# Patient Record
Sex: Female | Born: 1990 | Race: Black or African American | Hispanic: No | Marital: Single | Smoking: Never smoker
Health system: Southern US, Community
[De-identification: ages and names within clinical notes are randomized; demographics above are authoritative.]

## PROBLEM LIST (undated history)

## (undated) DIAGNOSIS — J45909 Unspecified asthma, uncomplicated: Secondary | ICD-10-CM

## (undated) DIAGNOSIS — K219 Gastro-esophageal reflux disease without esophagitis: Secondary | ICD-10-CM

## (undated) HISTORY — PX: LAPAROSCOPIC GASTRIC SLEEVE RESECTION: SHX5895

---

## 2015-06-21 ENCOUNTER — Encounter (HOSPITAL_BASED_OUTPATIENT_CLINIC_OR_DEPARTMENT_OTHER): Payer: Self-pay | Admitting: *Deleted

## 2015-06-21 ENCOUNTER — Emergency Department (HOSPITAL_BASED_OUTPATIENT_CLINIC_OR_DEPARTMENT_OTHER): Payer: Medicaid Other

## 2015-06-21 ENCOUNTER — Emergency Department (HOSPITAL_BASED_OUTPATIENT_CLINIC_OR_DEPARTMENT_OTHER)
Admission: EM | Admit: 2015-06-21 | Discharge: 2015-06-21 | Disposition: A | Discharge status: Home / Self Care | Payer: Medicaid Other | Attending: Emergency Medicine | Admitting: Emergency Medicine

## 2015-06-21 DIAGNOSIS — J45901 Unspecified asthma with (acute) exacerbation: Secondary | ICD-10-CM | POA: Insufficient documentation

## 2015-06-21 DIAGNOSIS — Z79899 Other long term (current) drug therapy: Secondary | ICD-10-CM | POA: Insufficient documentation

## 2015-06-21 DIAGNOSIS — R059 Cough, unspecified: Secondary | ICD-10-CM

## 2015-06-21 DIAGNOSIS — R05 Cough: Secondary | ICD-10-CM

## 2015-06-21 DIAGNOSIS — R0789 Other chest pain: Secondary | ICD-10-CM

## 2015-06-21 DIAGNOSIS — R000 Tachycardia, unspecified: Secondary | ICD-10-CM | POA: Diagnosis not present

## 2015-06-21 DIAGNOSIS — K219 Gastro-esophageal reflux disease without esophagitis: Secondary | ICD-10-CM | POA: Diagnosis not present

## 2015-06-21 DIAGNOSIS — Z7951 Long term (current) use of inhaled steroids: Secondary | ICD-10-CM | POA: Diagnosis not present

## 2015-06-21 DIAGNOSIS — R0602 Shortness of breath: Secondary | ICD-10-CM | POA: Diagnosis present

## 2015-06-21 HISTORY — DX: Gastro-esophageal reflux disease without esophagitis: K21.9

## 2015-06-21 HISTORY — DX: Unspecified asthma, uncomplicated: J45.909

## 2015-06-21 MED ORDER — IBUPROFEN 600 MG PO TABS: 600.0000 mg | ORAL_TABLET | Freq: Four times a day (QID) | ORAL | Status: AC | PRN

## 2015-06-21 MED ORDER — IBUPROFEN 800 MG PO TABS
800.0000 mg | ORAL_TABLET | Freq: Once | ORAL | Status: AC
  Administered 2015-06-21: 800 mg via ORAL
  Filled 2015-06-21: qty 1

## 2015-06-21 NOTE — ED Provider Notes (Signed)
CSN: [id number]     Arrival date & time 06/21/15  2104 History   First MD Initiated Contact with Patient 06/21/15 2123     Chief Complaint  Patient presents with  . Shortness of Breath     (Consider location/radiation/quality/duration/timing/severity/associated sxs/prior Treatment) HPI Michele Goodman is a 25 y.o. female who comes in for evaluation of shortness of breath. Patient reports symptoms have been ongoing and intermittent over the past one day. Endorses associated rhinorrhea, itchy and watery eyes, and cough. She reports associated left-sided chest discomfort that is worse with coughing. She denies any fevers, chills, leg swelling, hemoptysis, recent travel or surgeries, history of blood clot. States discomfort feels a lot like "when I had walking pneumonia". Denies any exertional discomfort. No other aggravating or modifying factors.  Past Medical History  Diagnosis Date  . Asthma   . Acid reflux    Past Surgical History  Procedure Laterality Date  . Laparoscopic gastric sleeve resection     No family history on file. Social History  Substance Use Topics  . Smoking status: Never Smoker   . Smokeless tobacco: Not on file  . Alcohol Use: Yes   OB History    No data available     Review of Systems A 10 point review of systems was completed and was negative except for pertinent positives and negatives as mentioned in the history of present illness     Allergies  Shrimp  Home Medications   Prior to Admission medications   Medication Sig Start Date End Date Taking? Authorizing Provider  esomeprazole (NEXIUM) 20 MG capsule Take 20 mg by mouth daily at 12 noon.   Yes Historical Provider, MD  Fluticasone-Salmeterol (ADVAIR) 100-50 MCG/DOSE AEPB Inhale 1 puff into the lungs 2 (two) times daily.   Yes Historical Provider, MD  ibuprofen (ADVIL,MOTRIN) 600 MG tablet Take 1 tablet (600 mg total) by mouth every 6 (six) hours as needed. 06/21/15   Rourke Mcquitty, PA-C   BP  120/74 mmHg  Pulse 97  Temp(Src) 98.6 F (37 C) (Oral)  Resp 18  Ht  (1.6 m)  Wt 133.811 kg  BMI 52.27 kg/m2  SpO2 100%  LMP 06/04/2015 Physical Exam  Constitutional: She is oriented to person, place, and time. She appears well-developed and well-nourished.  Morbidly obese African-American female  HENT:  Head: Normocephalic and atraumatic.  Mouth/Throat: Oropharynx is clear and moist.  Eyes: Conjunctivae are normal. Pupils are equal, round, and reactive to light. Right eye exhibits no discharge. Left eye exhibits no discharge. No scleral icterus.  Neck: Normal range of motion. Neck supple.  Cardiovascular: Normal rate, regular rhythm and normal heart sounds.   Pulmonary/Chest: Effort normal and breath sounds normal. No respiratory distress. She has no wheezes. She has no rales.  Chest discomfort is replicated with palpation of left pectoralis major musculature and intercostal spaces along sternal border. No other rashes or deformities, crepitus or other abnormalities noted.  Abdominal: Soft. There is no tenderness.  Musculoskeletal: Normal range of motion. She exhibits no edema or tenderness.  No unilateral leg swelling  Neurological: She is alert and oriented to person, place, and time.  Cranial Nerves II-XII grossly intact  Skin: Skin is warm and dry. No rash noted.  Psychiatric: She has a normal mood and affect.  Nursing note and vitals reviewed.   ED Course  Procedures (including critical care time) Labs Review Labs Reviewed - No data to display  Imaging Review Dg Chest 2 View  06/21/2015  CLINICAL DATA:  25 year old with current history of asthma presenting with acute onset of shortness of breath that began this morning. EXAM: CHEST  2 VIEW COMPARISON:  None. FINDINGS: Cardiomediastinal silhouette unremarkable. Lungs clear. Bronchovascular markings normal. Pulmonary vascularity normal. No pneumothorax. No pleural effusions. Visualized bony thorax intact. IMPRESSION:  Normal examination. Electronically Signed   By: Hulan Saas M.D.   On: 06/21/2015 22:23   I have personally reviewed and evaluated these images and lab results as part of my medical decision-making.   EKG Interpretation   Date/Time:  Saturday June 21 2015 22:08:47 EST Ventricular Rate:  91 PR Interval:  159 QRS Duration: 90 QT Interval:  369 QTC Calculation: 454 R Axis:   27 Text Interpretation:  Sinus rhythm Borderline T abnormalities, anterior  leads ED PHYSICIAN INTERPRETATION AVAILABLE IN [facility] Confirmed  by TEST, Record ([id number]) on 06/22/2015 9:32:57 AM     Meds given in ED:  Medications  ibuprofen (ADVIL,MOTRIN) tablet 800 mg (800 mg Oral Given 06/21/15 2338)    Discharge Medication List as of 06/21/2015 11:21 PM    START taking these medications   Details  ibuprofen (ADVIL,MOTRIN) 600 MG tablet Take 1 tablet (600 mg total) by mouth every 6 (six) hours as needed., Starting 06/21/2015, Until Discontinued, Print       Filed Vitals:   06/21/15 2111 06/21/15 2300  BP: 157/78 120/74  Pulse: 101 97  Temp: 98.6 F (37 C)   TempSrc: Oral   Resp: 18 18  Height:  (1.6 m)   Weight: 133.811 kg   SpO2: 100% 100%    MDM  Michele Goodman is a 25 y.o. female with a history of asthma who comes in for evaluation of cough, runny nose chest wall discomfort. On arrival, she is hemodynamically stable with a very slight tachycardia at 101. She is afebrile. Her exam is grossly unremarkable. EKG is reassuring. Chest x-ray shows no acute cardio pulmonary pathology. Tenderness is replicated with palpation of intercostal spaces in left peristernal area. Low suspicion for pulmonary embolus with no hypoxia, heart rate on EKG is 91. Low risk per well's criteria. Not consistent with ACS. Discussed continued OTC medications for URI at home as well as Motrin for chest wall discomfort. Patient verbalizes understanding and agrees with this plan. Discussed return precautions.  Overall, patient appears very well, hemodynamically stable and appropriate for discharge Final diagnoses:  Cough  Chest wall pain       Joycie Peek, PA-C 06/22/15 2321  Jerelyn Scott, MD 06/25/15 [phone]

## 2015-06-21 NOTE — Discharge Instructions (Signed)
°  There does not appear to be an emergent cause for your symptoms at this time. Your exam was reassuring as well as your chest x-ray and EKG. Please take her Motrin as needed for discomfort. Follow-up with your doctor as needed for reevaluation. Return to ED for any new or worsening symptoms.  Chest Wall Pain Chest wall pain is pain in or around the bones and muscles of your chest. Sometimes, an injury causes this pain. Sometimes, the cause may not be known. This pain may take several weeks or longer to get better. HOME CARE INSTRUCTIONS  Pay attention to any changes in your symptoms. Take these actions to help with your pain:   Rest as told by your health care provider.   Avoid activities that cause pain. These include any activities that use your chest muscles or your abdominal and side muscles to lift heavy items.   If directed, apply ice to the painful area:  Put ice in a plastic bag.  Place a towel between your skin and the bag.  Leave the ice on for 20 minutes, 2-3 times per day.  Take over-the-counter and prescription medicines only as told by your health care provider.  Do not use tobacco products, including cigarettes, chewing tobacco, and e-cigarettes. If you need help quitting, ask your health care provider.  Keep all follow-up visits as told by your health care provider. This is important. SEEK MEDICAL CARE IF:  You have a fever.  Your chest pain becomes worse.  You have new symptoms. SEEK IMMEDIATE MEDICAL CARE IF:  You have nausea or vomiting.  You feel sweaty or light-headed.  You have a cough with phlegm (sputum) or you cough up blood.  You develop shortness of breath.   This information is not intended to replace advice given to you by your health care provider. Make sure you discuss any questions you have with your health care provider.   Document Released: 05/10/2005 Document Revised: 01/29/2015 Document Reviewed: 08/05/2014 Elsevier Interactive  Patient Education [employer]

## 2015-06-21 NOTE — ED Provider Notes (Signed)
ED ECG REPORT   Date: 06/21/2015  Rate: 91  Rhythm: normal sinus rhythm  QRS Axis: normal  Intervals: normal  ST/T Wave abnormalities: nonspecific T wave changes  Conduction Disutrbances:none  Narrative Interpretation:   Old EKG Reviewed: none available  Link in epic not working for interpretation in muse  Jerelyn Scott, MD 06/21/15 2328

## 2015-06-21 NOTE — ED Notes (Signed)
Patient is alert and oriented x4.  She is complaining of shortness of breath that started this morning. Patient stated that she has a Hx asthma.  She has diminished lung sounds.

## 2015-06-21 NOTE — ED Notes (Signed)
C/o L chest pain, worse with cough, also reports dry cough and sob, states, "feel like I am starting a cold, but haven't had any wheezing", also lists: dry scratchy throat, tearing/itchy eyes. States, "no relief with albuterol neb or advair inhaler". Sx worse throughout today.

## 2017-03-31 ENCOUNTER — Encounter (HOSPITAL_BASED_OUTPATIENT_CLINIC_OR_DEPARTMENT_OTHER): Payer: Self-pay | Admitting: *Deleted

## 2017-03-31 ENCOUNTER — Emergency Department (HOSPITAL_BASED_OUTPATIENT_CLINIC_OR_DEPARTMENT_OTHER): Payer: BLUE CROSS/BLUE SHIELD

## 2017-03-31 ENCOUNTER — Emergency Department (HOSPITAL_BASED_OUTPATIENT_CLINIC_OR_DEPARTMENT_OTHER)
Admission: EM | Admit: 2017-03-31 | Discharge: 2017-03-31 | Disposition: A | Discharge status: Home / Self Care | Payer: BLUE CROSS/BLUE SHIELD | Attending: Emergency Medicine | Admitting: Emergency Medicine

## 2017-03-31 ENCOUNTER — Other Ambulatory Visit: Payer: Self-pay

## 2017-03-31 DIAGNOSIS — J45909 Unspecified asthma, uncomplicated: Secondary | ICD-10-CM | POA: Diagnosis not present

## 2017-03-31 DIAGNOSIS — Z79899 Other long term (current) drug therapy: Secondary | ICD-10-CM | POA: Diagnosis not present

## 2017-03-31 DIAGNOSIS — G44209 Tension-type headache, unspecified, not intractable: Secondary | ICD-10-CM | POA: Insufficient documentation

## 2017-03-31 DIAGNOSIS — R51 Headache: Secondary | ICD-10-CM | POA: Diagnosis present

## 2017-03-31 MED ORDER — IBUPROFEN 600 MG PO TABS: 600.0000 mg | ORAL_TABLET | Freq: Four times a day (QID) | ORAL | 0 refills | Status: AC | PRN

## 2017-03-31 MED ORDER — DIPHENHYDRAMINE HCL 50 MG/ML IJ SOLN
25.0000 mg | Freq: Once | INTRAMUSCULAR | Status: AC
  Administered 2017-03-31: 25 mg via INTRAVENOUS
  Filled 2017-03-31: qty 1

## 2017-03-31 MED ORDER — PROCHLORPERAZINE EDISYLATE 5 MG/ML IJ SOLN
10.0000 mg | Freq: Once | INTRAMUSCULAR | Status: AC
  Administered 2017-03-31: 10 mg via INTRAVENOUS
  Filled 2017-03-31: qty 2

## 2017-03-31 NOTE — ED Triage Notes (Signed)
Pt reports headache radiating into neck since this past Saturday with decreased appetite. Denies fever, n/v; reports diarrhea several days ago that's resolved. Reports Tylenol and Motrin ineffective. Pt has full ROM of head. Denies known injury/trauma; denies recent childbirth.

## 2017-03-31 NOTE — ED Provider Notes (Signed)
[facility] EMERGENCY DEPARTMENT Provider Note   CSN: [id number] Arrival date & time: 03/31/17  1752     History   Chief Complaint Chief Complaint  Patient presents with  . Headache    HPI Michele Goodman is a 26 y.o. female.  The history is provided by the patient. No language interpreter was used.  Headache   The current episode started more than 2 days ago. The pain is located in the occipital region. The quality of the pain is described as throbbing. The pain is moderate. The pain radiates to the left neck and right neck. Associated symptoms include anorexia. Pertinent negatives include no syncope, no nausea and no vomiting. She has tried acetaminophen and NSAIDs for the symptoms. The treatment provided no relief.    Past Medical History:  Diagnosis Date  . Acid reflux   . Asthma     There are no active problems to display for this patient.   Past Surgical History:  Procedure Laterality Date  . LAPAROSCOPIC GASTRIC SLEEVE RESECTION      OB History    No data available       Home Medications    Prior to Admission medications   Medication Sig Start Date End Date Taking? Authorizing Provider  Fluticasone-Salmeterol (ADVAIR) 100-50 MCG/DOSE AEPB Inhale 1 puff into the lungs 2 (two) times daily.   Yes [provider]  montelukast (SINGULAIR) 10 MG tablet Take 10 mg at bedtime by mouth.   Yes [provider]  omeprazole (PRILOSEC) 40 MG capsule Take 40 mg daily by mouth.   Yes [provider]  phentermine 37.5 MG capsule Take 37.5 mg every morning by mouth.   Yes [provider]  esomeprazole (NEXIUM) 20 MG capsule Take 20 mg by mouth daily at 12 noon.    [provider]  ibuprofen (ADVIL,MOTRIN) 600 MG tablet Take 1 tablet (600 mg total) by mouth every 6 (six) hours as needed. 06/21/15   Joycie Peekartner, Benjamin, PA-C    Family History No family history on file.  Social History Social History   Tobacco Use    . Smoking status: Never Smoker  . Smokeless tobacco: Never Used  Substance Use Topics  . Alcohol use: No    Frequency: Never  . Drug use: No     Allergies   Shrimp [shellfish allergy]   Review of Systems Review of Systems  Eyes: Positive for photophobia.  Cardiovascular: Negative for syncope.  Gastrointestinal: Positive for anorexia. Negative for diarrhea, nausea and vomiting.  Neurological: Positive for headaches.  All other systems reviewed and are negative.    Physical Exam Updated Vital Signs BP (!) 148/93 (BP Location: Left Arm)   Pulse 95   Temp 99.2 F (37.3 C) (Oral)   Resp 18   Ht 5\' 4"  (1.626 m)   Wt 136 kg (299 lb 13.2 oz)   LMP 03/29/2017 (Exact Date)   SpO2 100%   BMI 51.46 kg/m   Physical Exam  Constitutional: She is oriented to person, place, and time. She appears well-developed and well-nourished. She does not appear ill.  HENT:  Head: Atraumatic.  Mouth/Throat: Oropharynx is clear and moist.  Eyes: EOM are normal. Pupils are equal, round, and reactive to light.  Neck: Neck supple. No neck rigidity.  Cardiovascular: Normal rate and regular rhythm.  Pulmonary/Chest: Effort normal and breath sounds normal.  Abdominal: Soft. Bowel sounds are normal.  Musculoskeletal: Normal range of motion.  Neurological: She is alert and oriented to person,  place, and time. She has normal strength. No cranial nerve deficit or sensory deficit.  Skin: Skin is warm and dry.  Psychiatric: She has a normal mood and affect.  Nursing note and vitals reviewed.    ED Treatments / Results  Labs (all labs ordered are listed, but only abnormal results are displayed) Labs Reviewed - No data to display  EKG  EKG Interpretation None       Radiology Ct Head Wo Contrast  Result Date: 03/31/2017 CLINICAL DATA:  Headache radiating to neck for 5 days. Decreased appetite. EXAM: CT HEAD WITHOUT CONTRAST TECHNIQUE: Contiguous axial images were obtained from the base of  the skull through the vertex without intravenous contrast. COMPARISON:  None. FINDINGS: BRAIN: No intraparenchymal hemorrhage, mass effect nor midline shift. The ventricles and sulci are normal. No acute large vascular territory infarcts. No abnormal extra-axial fluid collections. Basal cisterns are patent. VASCULAR: Unremarkable. SKULL/SOFT TISSUES: No skull fracture. No significant soft tissue swelling. ORBITS/SINUSES: Dysconjugate gaze may be transient. RIGHT maxillary sinus mucoperiosteal reaction without air-fluid level. Mastoid air cells are well aerated. OTHER: None. IMPRESSION: Negative noncontrast CT HEAD. Electronically Signed   By: Awilda Metroourtnay  Bloomer M.D.   On: 03/31/2017 21:03    Procedures Procedures (including critical care time)  Medications Ordered in ED Medications - No data to display   Initial Impression / Assessment and Plan / ED Course  I have reviewed the triage vital signs and the nursing notes.  Pertinent labs & imaging results that were available during my care of the patient were reviewed by me and considered in my medical decision making (see chart for details).     Pt HA treated and improved while in ED. Normal CT.Pt is afebrile with no focal neuro deficits, nuchal rigidity, or change in vision. Patient to follow-up with PCP.  Pt verbalizes understanding and is agreeable with plan to dc.   Final Clinical Impressions(s) / ED Diagnoses   Final diagnoses:  Tension headache    ED Discharge Orders        Ordered    ibuprofen (ADVIL,MOTRIN) 600 MG tablet  Every 6 hours PRN     03/31/17 2213       Felicie MornSmith, Zeinab Rodwell, NP 04/01/17 Pernell Dupre0008    Benjiman CorePickering, Nathan, MD 04/01/17 [phone]

## 2017-03-31 NOTE — ED Notes (Signed)
Patient transported to CT 

## 2019-09-08 IMAGING — CT CT HEAD W/O CM
3 series · 15 of 47 positions shown, 18 images · non-contrast
Comparison: None.

CLINICAL DATA: Headache radiating to neck for 5 days. Decreased
appetite.

EXAM:
CT HEAD WITHOUT CONTRAST
TECHNIQUE: Contiguous axial images were obtained from the base of the skull
through the vertex without intravenous contrast.

[Series 2: head wo · axial · 0.43mm/px · z∈[-420,-295]mm · 9 of 31 slices shown, 12 images]
[im 3/31  brain]
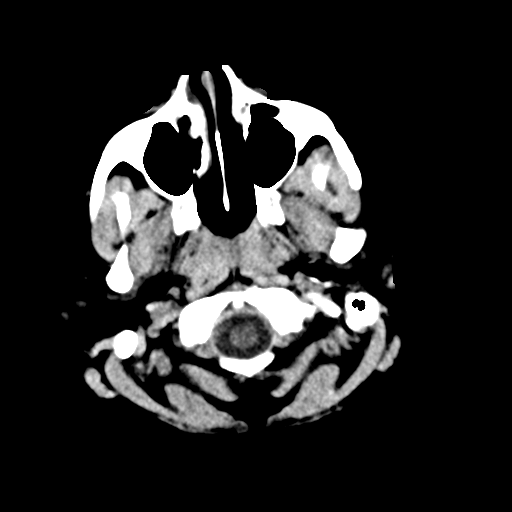
[im 3/31  bone]
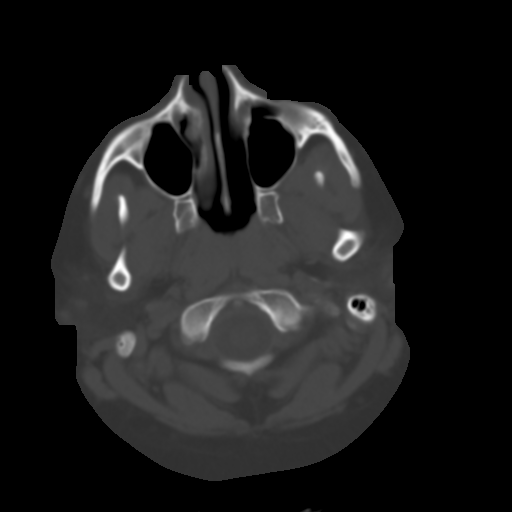
[im 6/31  brain]
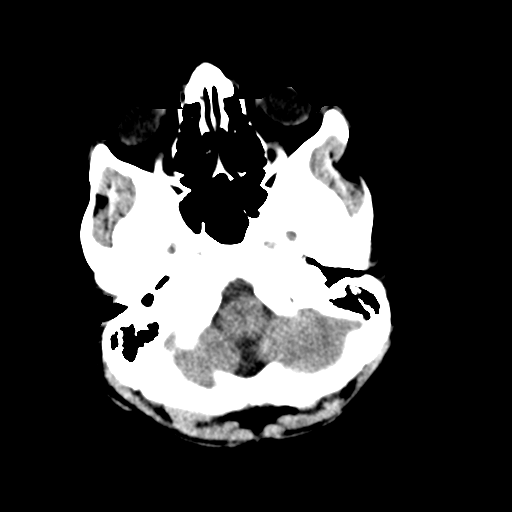
[im 9/31  brain]
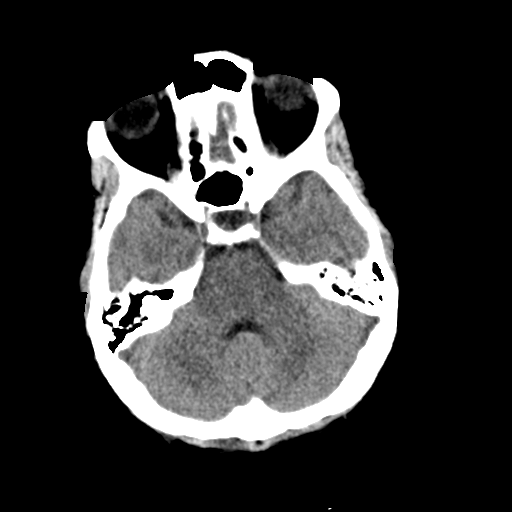
[im 12/31  brain]
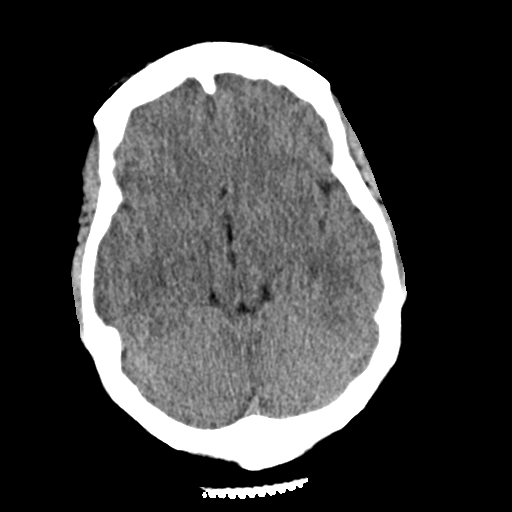
[im 16/31  brain]
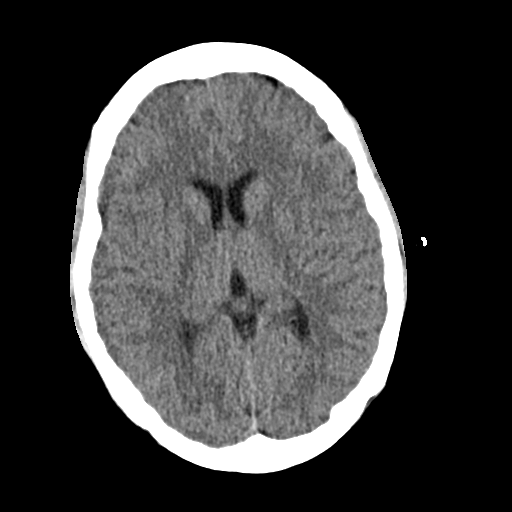
[im 16/31  bone]
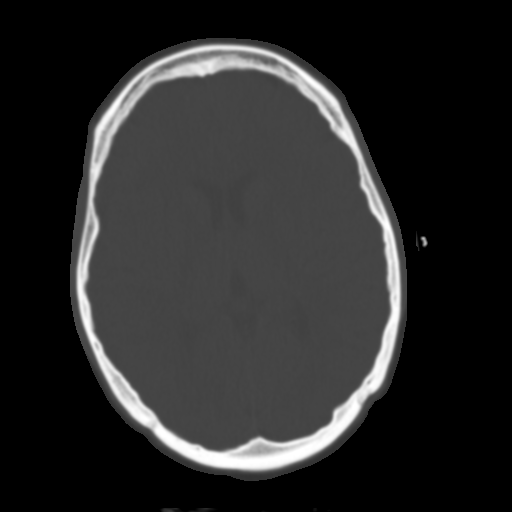
[im 19/31  brain]
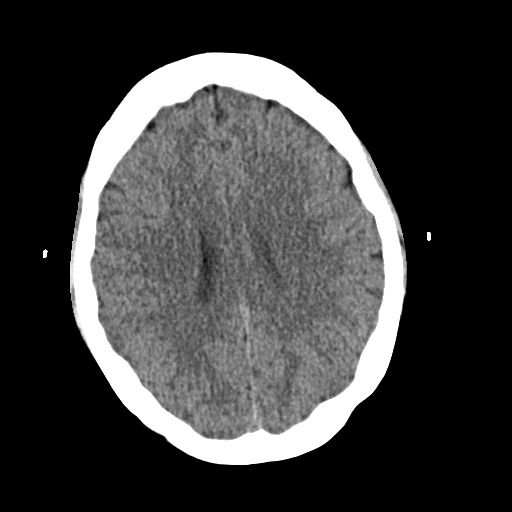
[im 22/31  brain]
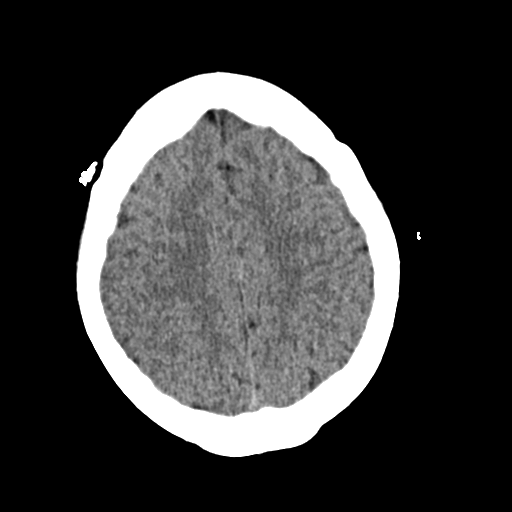
[im 25/31  brain]
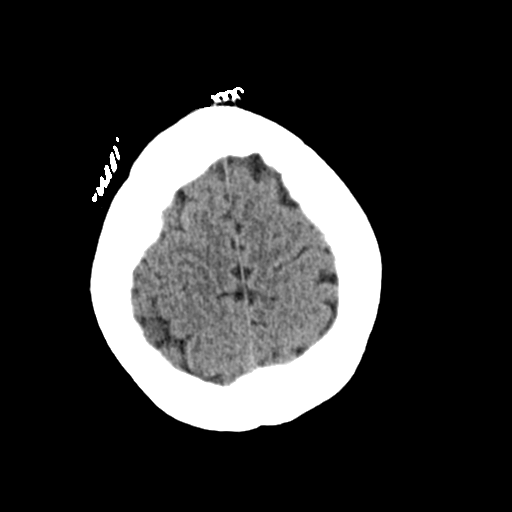
[im 28/31  brain]
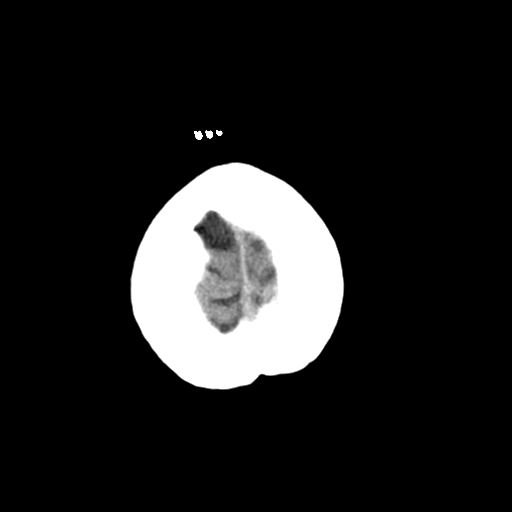
[im 28/31  bone]
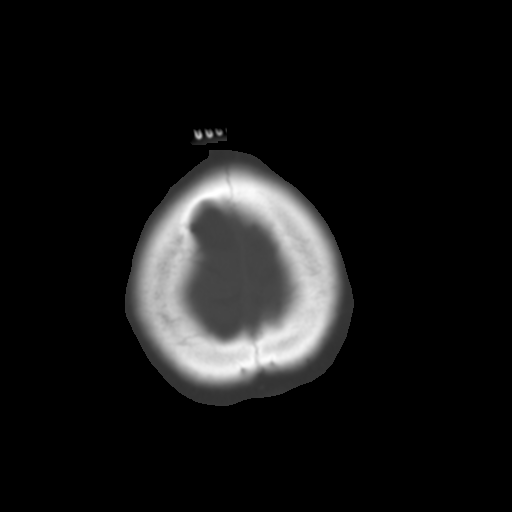

[Series 4: coronal soft · coronal · 0.30mm/px · 3 of 68 slices shown]
[im 23/68  brain]
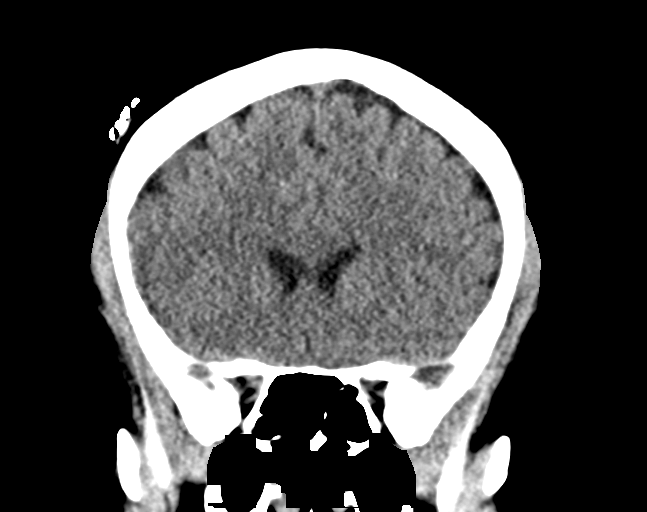
[im 30/68  brain]
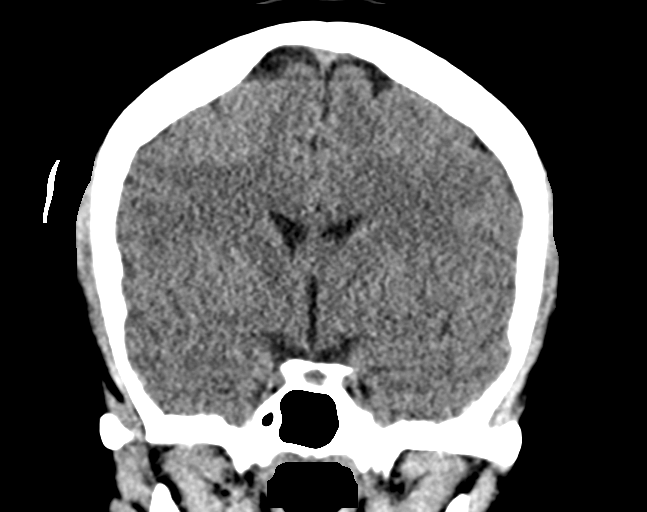
[im 38/68  brain]
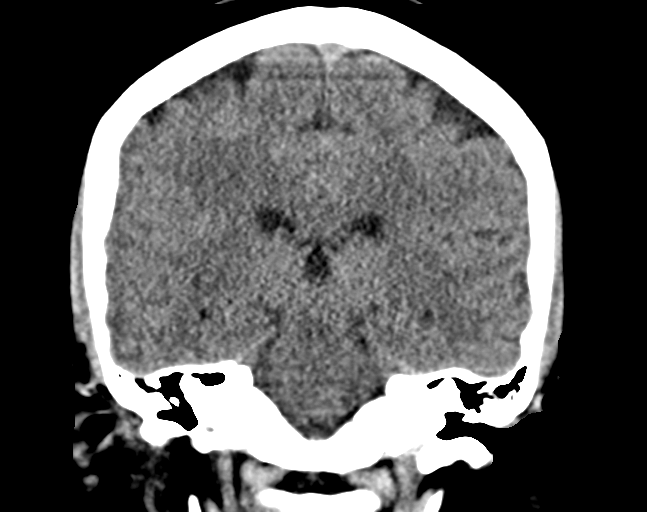

[Series 5: sag soft · sagittal · 0.30mm/px · 3 of 64 slices shown]
[im 22/64  brain]
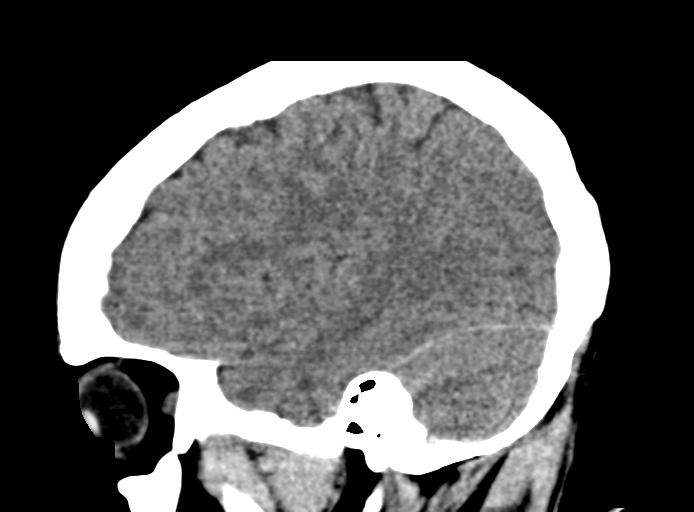
[im 32/64  brain]
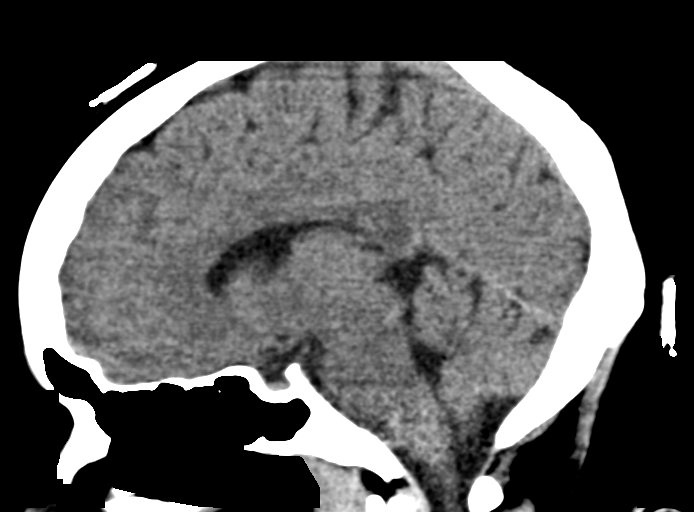
[im 43/64  brain]
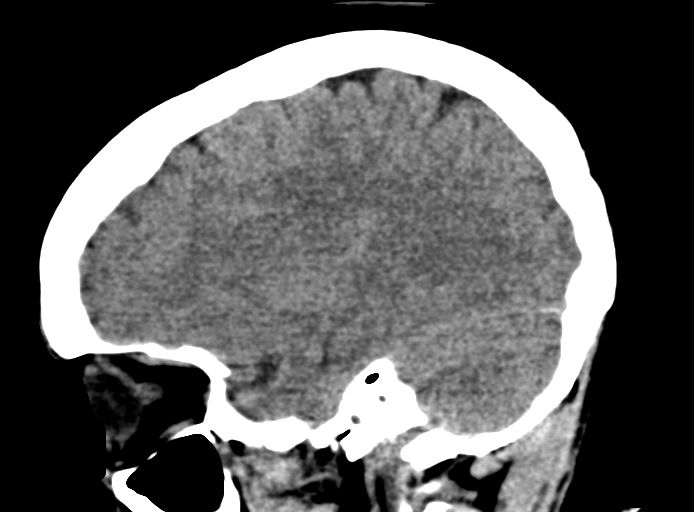

[15 of 47 positions shown; findings below may reference images not displayed]

FINDINGS: BRAIN: No intraparenchymal hemorrhage, mass effect nor midline
shift. The ventricles and sulci are normal. No acute large vascular
territory infarcts. No abnormal extra-axial fluid collections. Basal
cisterns are patent.

VASCULAR: Unremarkable.

SKULL/SOFT TISSUES: No skull fracture. No significant soft tissue
swelling.

ORBITS/SINUSES: Dysconjugate gaze may be transient. RIGHT maxillary
sinus mucoperiosteal reaction without air-fluid level. Mastoid air
cells are well aerated.

OTHER: None.
IMPRESSION: Negative noncontrast CT HEAD.

## 2022-10-27 ENCOUNTER — Other Ambulatory Visit (HOSPITAL_BASED_OUTPATIENT_CLINIC_OR_DEPARTMENT_OTHER): Payer: Self-pay

## 2023-04-29 ENCOUNTER — Other Ambulatory Visit: Payer: Self-pay

## 2023-04-29 ENCOUNTER — Other Ambulatory Visit (HOSPITAL_BASED_OUTPATIENT_CLINIC_OR_DEPARTMENT_OTHER): Payer: Self-pay

## 2023-04-29 ENCOUNTER — Other Ambulatory Visit (HOSPITAL_COMMUNITY): Payer: Self-pay

## 2023-04-29 MED ORDER — SEMAGLUTIDE-WEIGHT MANAGEMENT 0.25 MG/0.5ML ~~LOC~~ SOAJ
0.2500 mg | SUBCUTANEOUS | 0 refills | Status: AC
  Filled 2023-04-29: qty 2, 28d supply, fill #0

## 2023-05-25 ENCOUNTER — Emergency Department (HOSPITAL_BASED_OUTPATIENT_CLINIC_OR_DEPARTMENT_OTHER): Payer: MEDICAID

## 2023-05-25 ENCOUNTER — Encounter (HOSPITAL_BASED_OUTPATIENT_CLINIC_OR_DEPARTMENT_OTHER): Payer: Self-pay | Admitting: Emergency Medicine

## 2023-05-25 ENCOUNTER — Emergency Department (HOSPITAL_BASED_OUTPATIENT_CLINIC_OR_DEPARTMENT_OTHER)
Admission: EM | Admit: 2023-05-25 | Discharge: 2023-05-25 | Disposition: A | Discharge status: Home / Self Care | Payer: MEDICAID | Attending: Emergency Medicine | Admitting: Emergency Medicine

## 2023-05-25 ENCOUNTER — Other Ambulatory Visit: Payer: Self-pay

## 2023-05-25 DIAGNOSIS — J4521 Mild intermittent asthma with (acute) exacerbation: Secondary | ICD-10-CM | POA: Diagnosis not present

## 2023-05-25 DIAGNOSIS — B9789 Other viral agents as the cause of diseases classified elsewhere: Secondary | ICD-10-CM | POA: Insufficient documentation

## 2023-05-25 DIAGNOSIS — J069 Acute upper respiratory infection, unspecified: Secondary | ICD-10-CM | POA: Diagnosis not present

## 2023-05-25 DIAGNOSIS — Z7951 Long term (current) use of inhaled steroids: Secondary | ICD-10-CM | POA: Insufficient documentation

## 2023-05-25 DIAGNOSIS — R0602 Shortness of breath: Secondary | ICD-10-CM | POA: Diagnosis present

## 2023-05-25 DIAGNOSIS — Z20822 Contact with and (suspected) exposure to covid-19: Secondary | ICD-10-CM | POA: Diagnosis not present

## 2023-05-25 LAB — RESP PANEL BY RT-PCR (RSV, FLU A&B, COVID)  RVPGX2
Influenza A by PCR: NEGATIVE
Influenza B by PCR: NEGATIVE
Resp Syncytial Virus by PCR: NEGATIVE
SARS Coronavirus 2 by RT PCR: NEGATIVE

## 2023-05-25 MED ORDER — AEROCHAMBER Z-STAT PLUS/MEDIUM MISC
1.0000 | Freq: Once | Status: DC
  Filled 2023-05-25: qty 1

## 2023-05-25 MED ORDER — DEXAMETHASONE SODIUM PHOSPHATE 10 MG/ML IJ SOLN
10.0000 mg | Freq: Once | INTRAMUSCULAR | Status: AC
  Administered 2023-05-25: 10 mg via INTRAMUSCULAR
  Filled 2023-05-25: qty 1

## 2023-05-25 MED ORDER — PREDNISONE 50 MG PO TABS: 50.0000 mg | ORAL_TABLET | Freq: Every day | ORAL | 0 refills | Status: AC

## 2023-05-25 MED ORDER — IPRATROPIUM-ALBUTEROL 0.5-2.5 (3) MG/3ML IN SOLN
3.0000 mL | Freq: Once | RESPIRATORY_TRACT | Status: AC
  Administered 2023-05-25: 3 mL via RESPIRATORY_TRACT
  Filled 2023-05-25: qty 3

## 2023-05-25 NOTE — ED Notes (Signed)
 Patient called out via call bell complaining that she could not breath. I entered room, patient began berating me immediately about her breathing and why she was not being treated. I placed her on the saturation probe. Oxygen saturation was 100%. Patient talking in complete sentences without any difficulty. I explained to patient I was not aware of any medications and I was going to look at her chart. I left the room and began to pull up her chart. I found that the medication, duoneb had been ordered at 2021 and acknowledged by the RN without notifying me of the treatment. I then went to pull medication and the RN had pulled the medication. I explained to the RN that we, RT, do the respiratory treatments here and to please in the future do not acknowledge RT orders without making RT aware. So, I then entered the patients' room and patient was rude and stated, she did not want me to do the treatment and leave her room. I immediately left the room as asked. RN aware as well as physician, Dr. Dean. Patient was not in any respiratory distress, vitals WNL, RN proceeded to give the duo neb treatment.

## 2023-05-25 NOTE — ED Triage Notes (Signed)
 Pt with SHOB since this morning; hx of asthma, sts it feels like when she has bronchitis; MDI not helping

## 2023-05-25 NOTE — ED Provider Notes (Signed)
 [place] [facility] AT [facility] Provider Note   CSN: [id number] Arrival date & time: 05/25/23  1913     History  Chief Complaint  Patient presents with   Shortness of Breath    Michele Goodman is a 33 y.o. female.  Pt is a 33 yo female with pmhx significant for asthma.  Pt said she has been sob since this am.  She's had a cough.  No fever.  She's been using her inhaler, but it has not been helping.        Home Medications Prior to Admission medications   Medication Sig Start Date End Date Taking? Authorizing Provider  predniSONE  (DELTASONE ) 50 MG tablet Take 1 tablet (50 mg total) by mouth daily with breakfast. 05/25/23  Yes Dominik Lauricella, MD  esomeprazole (NEXIUM) 20 MG capsule Take 20 mg by mouth daily at 12 noon.    [provider]  Fluticasone-Salmeterol (ADVAIR) 100-50 MCG/DOSE AEPB Inhale 1 puff into the lungs 2 (two) times daily.    [provider]  ibuprofen  (ADVIL ,MOTRIN ) 600 MG tablet Take 1 tablet (600 mg total) by mouth every 6 (six) hours as needed. 06/21/15   Cartner, Morene, PA-C  ibuprofen  (ADVIL ,MOTRIN ) 600 MG tablet Take 1 tablet (600 mg total) every 6 (six) hours as needed by mouth for headache. 03/31/17   Claudene Lenis, NP  montelukast (SINGULAIR) 10 MG tablet Take 10 mg at bedtime by mouth.    [provider]  omeprazole (PRILOSEC) 40 MG capsule Take 40 mg daily by mouth.    [provider]  phentermine 37.5 MG capsule Take 37.5 mg every morning by mouth.    [provider]  Semaglutide -Weight Management 0.25 MG/0.5ML SOAJ Inject 0.25 mg into the skin once a week. 03/03/23         Allergies    Shrimp [shellfish allergy]    Review of Systems   Review of Systems  Respiratory:  Positive for cough and shortness of breath.   All other systems reviewed and are negative.   Physical Exam Updated Vital Signs BP 132/84 (BP Location: Right Arm)   Pulse 87   Temp 98.8 F (37.1 C)   Resp  20   Ht 5' 3.25 (1.607 m)   Wt 119.7 kg   LMP 05/04/2023   SpO2 99%   BMI 46.40 kg/m  Physical Exam Vitals and nursing note reviewed.  Constitutional:      Appearance: She is well-developed. She is obese.  HENT:     Head: Normocephalic and atraumatic.     Mouth/Throat:     Mouth: Mucous membranes are moist.     Pharynx: Oropharynx is clear.  Eyes:     Extraocular Movements: Extraocular movements intact.     Pupils: Pupils are equal, round, and reactive to light.  Cardiovascular:     Rate and Rhythm: Normal rate and regular rhythm.  Pulmonary:     Breath sounds: Wheezing present.  Abdominal:     General: Bowel sounds are normal.     Palpations: Abdomen is soft.  Musculoskeletal:        General: Normal range of motion.     Cervical back: Normal range of motion and neck supple.  Skin:    General: Skin is warm.     Capillary Refill: Capillary refill takes less than 2 seconds.  Neurological:     General: No focal deficit present.     Mental Status: She is alert and oriented to person, place, and  time.  Psychiatric:        Mood and Affect: Mood normal.        Behavior: Behavior normal.     ED Results / Procedures / Treatments   Labs (all labs ordered are listed, but only abnormal results are displayed) Labs Reviewed  RESP PANEL BY RT-PCR (RSV, FLU A&B, COVID)  [id number]    EKG None  Radiology DG Chest 2 View Result Date: 05/25/2023 CLINICAL DATA:  Shortness of breath. EXAM: CHEST - 2 VIEW COMPARISON:  06/21/2015 FINDINGS: The cardiomediastinal contours are normal. Mild peribronchial thickening. Pulmonary vasculature is normal. No consolidation, pleural effusion, or pneumothorax. No acute osseous abnormalities are seen. IMPRESSION: Mild peribronchial thickening suggesting bronchitis or reactive airways. Electronically Signed   By: Andrea Gasman M.D.   On: 05/25/2023 21:19    Procedures Procedures    Medications Ordered in ED Medications  aerochamber Z-Stat  Plus/medium 1 each (has no administration in time range)  dexamethasone  (DECADRON ) injection 10 mg (10 mg Intramuscular Given 05/25/23 2110)  ipratropium-albuterol  (DUONEB) 0.5-2.5 (3) MG/3ML nebulizer solution 3 mL (3 mLs Nebulization Given 05/25/23 2110)    ED Course/ Medical Decision Making/ A&P                                 Medical Decision Making Amount and/or Complexity of Data Reviewed Radiology: ordered.  Risk Prescription drug management.   This patient presents to the ED for concern of sob, this involves an extensive number of treatment options, and is a complaint that carries with it a high risk of complications and morbidity.  The differential diagnosis includes covid/flu/rsv, pna   Co morbidities that complicate the patient evaluation  asthma   Additional history obtained:  Additional history obtained from epic chart review  Lab Tests:  I Ordered, and personally interpreted labs.  The pertinent results include:  covid/flu/rsv neg   Imaging Studies ordered:  I ordered imaging studies including cxr  I independently visualized and interpreted imaging which showed  Mild peribronchial thickening suggesting bronchitis or reactive  airways.   I agree with the radiologist interpretation   Medicines ordered and prescription drug management:  I ordered medication including duoneb/decadron   for sx  Reevaluation of the patient after these medicines showed that the patient improved I have reviewed the patients home medicines and have made adjustments as needed   Test Considered:  cxr   Critical Interventions:  nebs   Problem List / ED Course:  URI/Asthma exac:  covid/flu/rsv neg.  Pt is feeling much better after decadron  and nebs.  She does not have a spacer so is given one before she goes.  She is stable for d/c.  Return if worse.  F/u with pcp.   Reevaluation:  After the interventions noted above, I reevaluated the patient and found that they have  :improved   Social Determinants of Health:  Lives at home   Dispostion:  After consideration of the diagnostic results and the patients response to treatment, I feel that the patent would benefit from discharge with outpatient f/u.          Final Clinical Impression(s) / ED Diagnoses Final diagnoses:  Mild intermittent asthma with exacerbation  Viral upper respiratory tract infection    Rx / DC Orders ED Discharge Orders          Ordered    predniSONE  (DELTASONE ) 50 MG tablet  Daily with breakfast  05/25/23 2131              Dean Clarity, MD 05/25/23 2134

## 2023-06-03 ENCOUNTER — Other Ambulatory Visit (HOSPITAL_BASED_OUTPATIENT_CLINIC_OR_DEPARTMENT_OTHER): Payer: Self-pay

## 2023-06-03 MED ORDER — WEGOVY 0.5 MG/0.5ML ~~LOC~~ SOAJ
0.5000 mL | SUBCUTANEOUS | 0 refills | Status: DC
  Filled 2023-06-03: qty 2, 28d supply, fill #0

## 2023-07-01 ENCOUNTER — Other Ambulatory Visit (HOSPITAL_BASED_OUTPATIENT_CLINIC_OR_DEPARTMENT_OTHER): Payer: Self-pay

## 2023-07-01 MED ORDER — WEGOVY 1 MG/0.5ML ~~LOC~~ SOAJ
1.0000 mg | SUBCUTANEOUS | 0 refills | Status: AC
  Filled 2023-07-01: qty 2, 28d supply, fill #0
